# Patient Record
Sex: Male | Born: 1975 | Race: White | Hispanic: No | Marital: Married
Health system: Southern US, Community
[De-identification: ages and names within clinical notes are randomized; demographics above are authoritative.]

## PROBLEM LIST (undated history)

## (undated) DIAGNOSIS — Z87442 Personal history of urinary calculi: Secondary | ICD-10-CM

## (undated) DIAGNOSIS — K649 Unspecified hemorrhoids: Secondary | ICD-10-CM

## (undated) DIAGNOSIS — T7840XA Allergy, unspecified, initial encounter: Secondary | ICD-10-CM

## (undated) HISTORY — DX: Personal history of urinary calculi: Z87.442

## (undated) HISTORY — PX: HYDROCELE EXCISION: SHX482

## (undated) HISTORY — DX: Allergy, unspecified, initial encounter: T78.40XA

## (undated) HISTORY — DX: Unspecified hemorrhoids: K64.9

---

## 2009-07-06 ENCOUNTER — Ambulatory Visit (HOSPITAL_BASED_OUTPATIENT_CLINIC_OR_DEPARTMENT_OTHER): Admission: RE | Admit: 2009-07-06 | Discharge: 2009-07-06 | Payer: Self-pay | Admitting: Urology

## 2010-02-11 ENCOUNTER — Encounter: Admission: RE | Admit: 2010-02-11 | Discharge: 2010-02-11 | Payer: Self-pay | Admitting: Family Medicine

## 2015-05-17 ENCOUNTER — Ambulatory Visit: Payer: Self-pay | Admitting: Primary Care

## 2015-05-28 ENCOUNTER — Ambulatory Visit: Payer: Self-pay | Admitting: Primary Care

## 2015-05-30 ENCOUNTER — Ambulatory Visit: Payer: Self-pay | Admitting: Primary Care

## 2015-06-11 ENCOUNTER — Encounter: Payer: Self-pay | Admitting: Primary Care

## 2015-06-11 ENCOUNTER — Ambulatory Visit (INDEPENDENT_AMBULATORY_CARE_PROVIDER_SITE_OTHER): Payer: BC Managed Care – PPO | Admitting: Primary Care

## 2015-06-11 VITALS — BP 144/84 | HR 84 | Temp 98.3°F | Ht 74.0 in | Wt 224.0 lb

## 2015-06-11 DIAGNOSIS — R030 Elevated blood-pressure reading, without diagnosis of hypertension: Secondary | ICD-10-CM | POA: Diagnosis not present

## 2015-06-11 NOTE — Progress Notes (Signed)
Pre visit review using our clinic review tool, if applicable. No additional management support is needed unless otherwise documented below in the visit note. 

## 2015-06-11 NOTE — Patient Instructions (Signed)
Please schedule a physical with me in the next 3 months. You will also schedule a lab only appointment one week prior. We will discuss your lab results during your physical.  Check your blood pressure daily, around the same time of day, for the next 2 weeks.   Ensure that you have rested for 30 minutes prior to checking your blood pressure. Record your readings and call them into the office in 2 weeks.  It was a pleasure to meet you today! Please don't hesitate to call me with any questions. Welcome to [facility]!

## 2015-06-11 NOTE — Progress Notes (Signed)
Subjective:    Patient ID: Herbert Nichols, male    DOB: 1976/04/15, 39 y.o.   MRN: [id number]  HPI  Herbert Nichols is a 39 year old male who presents today to establish care and discuss the problems mentioned below. Will obtain old records. His last physical [facility] at [facility] over 5 years ago.   1) Asthma: Diagnosed as a child, had a few flare-ups as a teenager while playing sports. Denies flare-ups as an adult.   2) Elevated Blood Pressure Reading: He checks his blood pressure at home irregularly. His last reading was 3 months ago. He has noticed several readings of 130-140's/80's. Elevated in the clinic today. Denies chest pain, headaches, dizziness.   BP Readings from Last 3 Encounters:  06/11/15 144/84     Review of Systems  Constitutional: Negative for unexpected weight change.  HENT: Negative for rhinorrhea.   Respiratory: Negative for cough and shortness of breath.   Cardiovascular: Negative for chest pain.  Gastrointestinal: Negative for diarrhea and constipation.  Genitourinary: Negative for difficulty urinating.  Musculoskeletal: Negative for myalgias and arthralgias.  Skin: Negative for rash.  Allergic/Immunologic: Positive for environmental allergies.  Neurological: Negative for dizziness, numbness and headaches.  Psychiatric/Behavioral:       Denies concerns for anxiety or depression.       Past Medical History  Diagnosis Date  . Allergy   . History of kidney stones     5 to 6 years ago  . Hemorrhoids     Social History   Social History  . Marital Status: Married    Spouse Name: N/A  . Number of Children: N/A  . Years of Education: N/A   Occupational History  . Not on file.   Social History Main Topics  . Smoking status: Former Games developermoker  . Smokeless tobacco: Not on file  . Alcohol Use: 0.0 oz/week    0 Standard drinks or equivalent per week     Comment: social  . Drug Use: Not on file  . Sexual Activity: Not on file   Other Topics Concern    . Not on file   Social History Narrative   Married.   2 children.   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   Highest level of education is Masters.   Enjoys spending time with his family.     Past Surgical History  Procedure Laterality Date  . Hydrocele excision      Family History  Problem Relation Age of Onset  . Hypertension Mother   . Hypertension Father   . Mental illness Brother   . Colon cancer Maternal Aunt   . Hypertension Maternal Grandmother   . Mental illness Maternal Grandmother   . Hypertension Maternal Grandfather   . Diabetes Maternal Grandfather   . Asthma Paternal Grandmother   . Hyperlipidemia Paternal Grandmother   . Hypertension Paternal Grandmother   . Hypertension Paternal Grandfather   . Prostate cancer Maternal Grandfather     No Known Allergies  No current outpatient prescriptions on file prior to visit.   No current facility-administered medications on file prior to visit.    BP 144/84 mmHg  Pulse 84  Temp(Src) 98.3 F (36.8 C) (Oral)  Ht 6\' 2"  (1.88 m)  Wt 224 lb (101.606 kg)  BMI 28.75 kg/m2  SpO2 98%    Objective:   Physical Exam  Constitutional: He is oriented to person, place, and time. He appears well-nourished.  Cardiovascular: Normal rate and regular rhythm.  Pulmonary/Chest: Effort normal and breath sounds normal.  Neurological: He is alert and oriented to person, place, and time.  Skin: Skin is warm and dry.  Psychiatric: He has a normal mood and affect.          Assessment & Plan:

## 2015-06-11 NOTE — Assessment & Plan Note (Signed)
Elevated in clinic today.  He checks at home very irregularly and has noted readings around 130-140/80's. Strong family history of hypertension. Will have him record BP daily for 2 weeks and send them to me.

## 2015-06-25 ENCOUNTER — Telehealth: Payer: Self-pay | Admitting: Primary Care

## 2015-06-25 NOTE — Telephone Encounter (Signed)
Tried to call patient this morning and this afternoon. Received this message, "due network difficult cannot connect to subcriber."

## 2015-06-25 NOTE — Telephone Encounter (Signed)
Message left for patient to return my call for BP readings. 

## 2015-06-25 NOTE — Telephone Encounter (Signed)
Will you please check on Herbert Nichols's BP? His reading was elevated during his new patient appointment 2 weeks ago.

## 2015-06-26 ENCOUNTER — Encounter: Payer: Self-pay | Admitting: *Deleted

## 2015-06-26 NOTE — Telephone Encounter (Signed)
Sending patient a letter asking him to call our office for BP readings.

## 2015-06-27 ENCOUNTER — Telehealth: Payer: Self-pay | Admitting: Primary Care

## 2015-06-27 NOTE — Telephone Encounter (Signed)
BP levels noted and are stable.

## 2015-06-27 NOTE — Telephone Encounter (Signed)
Patient called back. Patient stated that he takes his BP in the evening and his BP reading are:  06/11/2015 124/84 06/12/2015 126/83 06/13/2015 130/84 06/14/2015 127/83 06/15/2015 126/86 06/16/2015 127/83 06/17/2015 122/83 06/18/2015 132/82 06/19/2015 122/87 06/20/2015 122/86 06/21/2015 -------- 06/22/2015 -------- 06/23/2015 121/81 06/24/2015 125/77 06/25/2015 129/81 06/26/2015 130/84

## 2015-08-06 ENCOUNTER — Other Ambulatory Visit: Payer: Self-pay | Admitting: Primary Care

## 2015-08-06 DIAGNOSIS — Z0000 Encounter for general adult medical examination without abnormal findings: Secondary | ICD-10-CM

## 2015-08-10 ENCOUNTER — Other Ambulatory Visit: Payer: BC Managed Care – PPO

## 2015-08-17 ENCOUNTER — Encounter: Payer: BC Managed Care – PPO | Admitting: Primary Care

## 2015-08-31 ENCOUNTER — Other Ambulatory Visit (INDEPENDENT_AMBULATORY_CARE_PROVIDER_SITE_OTHER): Payer: BC Managed Care – PPO

## 2015-08-31 DIAGNOSIS — Z0000 Encounter for general adult medical examination without abnormal findings: Secondary | ICD-10-CM

## 2015-08-31 LAB — COMPREHENSIVE METABOLIC PANEL
ALT: 32 U/L (ref 0–53)
AST: 22 U/L (ref 0–37)
Albumin: 4.5 g/dL (ref 3.5–5.2)
Alkaline Phosphatase: 48 U/L (ref 39–117)
BILIRUBIN TOTAL: 0.9 mg/dL (ref 0.2–1.2)
BUN: 16 mg/dL (ref 6–23)
CO2: 30 meq/L (ref 19–32)
CREATININE: 0.85 mg/dL (ref 0.40–1.50)
Calcium: 9.4 mg/dL (ref 8.4–10.5)
Chloride: 104 mEq/L (ref 96–112)
GFR: 105.51 mL/min (ref >60.00)
GLUCOSE: 101 mg/dL — AB (ref 70–99)
Potassium: 4.4 mEq/L (ref 3.5–5.1)
Sodium: 139 mEq/L (ref 135–145)
Total Protein: 7.1 g/dL (ref 6.0–8.3)

## 2015-08-31 LAB — HEMOGLOBIN A1C: Hgb A1c MFr Bld: 5.2 % (ref 4.6–6.5)

## 2015-08-31 LAB — LIPID PANEL
CHOL/HDL RATIO: 5
Cholesterol: 187 mg/dL (ref 0–200)
HDL: 37.4 mg/dL — ABNORMAL LOW (ref >39.00)
LDL CALC: 130 mg/dL — AB (ref 0–99)
NonHDL: 149.29
Triglycerides: 96 mg/dL (ref 0.0–149.0)
VLDL: 19.2 mg/dL (ref 0.0–40.0)

## 2015-09-07 ENCOUNTER — Encounter: Payer: Self-pay | Admitting: Primary Care

## 2015-09-07 ENCOUNTER — Ambulatory Visit (INDEPENDENT_AMBULATORY_CARE_PROVIDER_SITE_OTHER): Payer: BC Managed Care – PPO | Admitting: Primary Care

## 2015-09-07 VITALS — BP 134/84 | HR 91 | Temp 97.7°F | Ht 74.0 in | Wt 223.0 lb

## 2015-09-07 DIAGNOSIS — Z0000 Encounter for general adult medical examination without abnormal findings: Secondary | ICD-10-CM | POA: Diagnosis not present

## 2015-09-07 DIAGNOSIS — E7800 Pure hypercholesterolemia, unspecified: Secondary | ICD-10-CM | POA: Diagnosis not present

## 2015-09-07 DIAGNOSIS — R030 Elevated blood-pressure reading, without diagnosis of hypertension: Secondary | ICD-10-CM

## 2015-09-07 NOTE — Progress Notes (Signed)
Pre visit review using our clinic review tool, if applicable. No additional management support is needed unless otherwise documented below in the visit note. 

## 2015-09-07 NOTE — Assessment & Plan Note (Addendum)
LDL of 130 during physical. Low cardiac risk at this time. Discussed the importance of a healthy diet and regular exercise in order for weight loss and to reduce risk of other medical diseases.  Will recheck in 1 year.

## 2015-09-07 NOTE — Progress Notes (Signed)
Subjective:    Patient ID: Herbert Nichols, male    DOB: October 13, 1975, 40 y.o.   MRN: [id number]  HPI  Herbert Nichols is a 40 year old male who presents today for complete physical.  Immunizations: -Tetanus: Completed over 10 years ago.  -Influenza: Declines.   Diet: Endorses a fair diet. Breakfast: Oatmeal, cereal, bacon and eggs Lunch: Eats out (subway, chipotle) Dinner: Home cooked meals (meat, vegetables) Snacks: None Desserts: None Beverages: Coffee, water, soda  Exercise: He is currently not exercising. Eye exam: 2 years ago. Denies changes in vision. Dental exam: Completes semi-annually.    Review of Systems  Constitutional: Negative for fever and unexpected weight change.  HENT: Positive for congestion and sinus pressure.   Respiratory: Negative for cough and shortness of breath.   Cardiovascular: Negative for chest pain.  Gastrointestinal: Negative for diarrhea and constipation.  Genitourinary: Negative for difficulty urinating.  Musculoskeletal: Negative for myalgias and arthralgias.  Skin: Negative for rash.  Allergic/Immunologic: Positive for environmental allergies.  Neurological: Negative for dizziness, numbness and headaches.  Psychiatric/Behavioral:       Denies concerns for anxiety or depression       Past Medical History  Diagnosis Date  . Allergy   . History of kidney stones     5 to 6 years ago  . Hemorrhoids     Social History   Social History  . Marital Status: Married    Spouse Name: N/A  . Number of Children: N/A  . Years of Education: N/A   Occupational History  . Not on file.   Social History Main Topics  . Smoking status: Former Games developer  . Smokeless tobacco: Not on file  . Alcohol Use: 0.0 oz/week    0 Standard drinks or equivalent per week     Comment: social  . Drug Use: Not on file  . Sexual Activity: Not on file   Other Topics Concern  . Not on file   Social History Narrative   Married.   2 children.   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   Highest level of education is Masters.   Enjoys spending time with his family.     Past Surgical History  Procedure Laterality Date  . Hydrocele excision      Family History  Problem Relation Age of Onset  . Hypertension Mother   . Hypertension Father   . Mental illness Brother   . Colon cancer Maternal Aunt   . Hypertension Maternal Grandmother   . Mental illness Maternal Grandmother   . Hypertension Maternal Grandfather   . Diabetes Maternal Grandfather   . Asthma Paternal Grandmother   . Hyperlipidemia Paternal Grandmother   . Hypertension Paternal Grandmother   . Hypertension Paternal Grandfather   . Prostate cancer Maternal Grandfather     No Known Allergies  Current Outpatient Prescriptions on File Prior to Visit  Medication Sig Dispense Refill  . cetirizine (ZYRTEC) 10 MG tablet Take 10 mg by mouth daily.     No current facility-administered medications on file prior to visit.    BP 134/84 mmHg  Pulse 91  Temp(Src) 97.7 F (36.5 C) (Oral)  Ht  (1.88 m)  Wt 223 lb (101.152 kg)  BMI 28.62 kg/m2  SpO2 98%    Objective:   Physical Exam  Constitutional: He is oriented to person, place, and time. He appears well-nourished.  HENT:  Right Ear: Tympanic membrane and ear canal normal.  Left Ear: Tympanic membrane and  ear canal normal.  Nose: Nose normal. Right sinus exhibits no maxillary sinus tenderness and no frontal sinus tenderness. Left sinus exhibits no maxillary sinus tenderness and no frontal sinus tenderness.  Mouth/Throat: Oropharynx is clear and moist.  Eyes: Conjunctivae and EOM are normal. Pupils are equal, round, and reactive to light.  Neck: Neck supple. No thyromegaly present.  Cardiovascular: Normal rate, regular rhythm and normal heart sounds.   Pulmonary/Chest: Effort normal and breath sounds normal. He has no wheezes. He has no rales.  Abdominal: Soft. Bowel sounds are normal. There is no tenderness.    Musculoskeletal: Normal range of motion.  Neurological: He is alert and oriented to person, place, and time. He has normal reflexes. No cranial nerve deficit.  Skin: Skin is warm and dry.  Psychiatric: He has a normal mood and affect.          Assessment & Plan:

## 2015-09-07 NOTE — Assessment & Plan Note (Signed)
Tdap UTD. Declines flu. Exam unremarkable.  Labs with slightly elevated LDL. Discussed the importance of a healthy diet and regular exercise in order for weight loss and to reduce risk of other medical diseases.  Follow up in 1 year for repeat physical.

## 2015-09-07 NOTE — Patient Instructions (Signed)
It is important that you improve your diet. Please limit fast foods, fried foods, greasy foods, sugary drinks, etc. Increase your consumption of fresh fruits and vegetables.  You need to consume about 2 liters of water daily.  Start exercising. You should be getting 1 hour of moderate intensity exercise 5 days weekly.  Your labs show borderline high cholesterol. Work on making the changes discussed this morning and we will recheck your levels in 1 year.  Follow up in 1 year for repeat physical or sooner if needed.  It was a pleasure to see you today!

## 2015-09-07 NOTE — Assessment & Plan Note (Signed)
Stable in office today. Home readings stable. Will continue to monitor.

## 2016-08-29 ENCOUNTER — Other Ambulatory Visit: Payer: Self-pay | Admitting: Primary Care

## 2016-08-29 DIAGNOSIS — Z0000 Encounter for general adult medical examination without abnormal findings: Secondary | ICD-10-CM

## 2016-09-05 ENCOUNTER — Other Ambulatory Visit: Payer: BC Managed Care – PPO

## 2016-09-12 ENCOUNTER — Encounter: Payer: BC Managed Care – PPO | Admitting: Primary Care

## 2019-10-01 ENCOUNTER — Emergency Department (HOSPITAL_COMMUNITY): Payer: BC Managed Care – PPO

## 2019-10-01 ENCOUNTER — Emergency Department (HOSPITAL_COMMUNITY)
Admission: EM | Admit: 2019-10-01 | Discharge: 2019-10-01 | Disposition: A | Discharge status: Home / Self Care | Payer: BC Managed Care – PPO | Attending: Emergency Medicine | Admitting: Emergency Medicine

## 2019-10-01 ENCOUNTER — Other Ambulatory Visit: Payer: Self-pay

## 2019-10-01 ENCOUNTER — Encounter (HOSPITAL_COMMUNITY): Payer: Self-pay | Admitting: Emergency Medicine

## 2019-10-01 DIAGNOSIS — S0101XA Laceration without foreign body of scalp, initial encounter: Secondary | ICD-10-CM | POA: Insufficient documentation

## 2019-10-01 DIAGNOSIS — Y999 Unspecified external cause status: Secondary | ICD-10-CM | POA: Insufficient documentation

## 2019-10-01 DIAGNOSIS — S060X9A Concussion with loss of consciousness of unspecified duration, initial encounter: Secondary | ICD-10-CM | POA: Diagnosis not present

## 2019-10-01 DIAGNOSIS — Z23 Encounter for immunization: Secondary | ICD-10-CM | POA: Diagnosis not present

## 2019-10-01 DIAGNOSIS — T148XXA Other injury of unspecified body region, initial encounter: Secondary | ICD-10-CM

## 2019-10-01 DIAGNOSIS — W11XXXA Fall on and from ladder, initial encounter: Secondary | ICD-10-CM | POA: Diagnosis not present

## 2019-10-01 DIAGNOSIS — Y93H2 Activity, gardening and landscaping: Secondary | ICD-10-CM | POA: Insufficient documentation

## 2019-10-01 DIAGNOSIS — Y929 Unspecified place or not applicable: Secondary | ICD-10-CM | POA: Diagnosis not present

## 2019-10-01 DIAGNOSIS — W1789XA Other fall from one level to another, initial encounter: Secondary | ICD-10-CM

## 2019-10-01 LAB — COMPREHENSIVE METABOLIC PANEL
ALT: 51 U/L — ABNORMAL HIGH (ref 0–44)
AST: 39 U/L (ref 15–41)
Albumin: 4.5 g/dL (ref 3.5–5.0)
Alkaline Phosphatase: 47 U/L (ref 38–126)
Anion gap: 15 (ref 5–15)
BUN: 15 mg/dL (ref 6–20)
CO2: 23 mmol/L (ref 22–32)
Calcium: 9.6 mg/dL (ref 8.9–10.3)
Chloride: 103 mmol/L (ref 98–111)
Creatinine, Ser: 0.98 mg/dL (ref 0.61–1.24)
GFR calc Af Amer: >60 mL/min (ref >60)
GFR calc non Af Amer: >60 mL/min (ref >60)
Glucose, Bld: 88 mg/dL (ref 70–99)
Potassium: 3.6 mmol/L (ref 3.5–5.1)
Sodium: 141 mmol/L (ref 135–145)
Total Bilirubin: 1.1 mg/dL (ref 0.3–1.2)
Total Protein: 7.1 g/dL (ref 6.5–8.1)

## 2019-10-01 LAB — CBC WITH DIFFERENTIAL/PLATELET
Abs Immature Granulocytes: 0.08 10*3/uL — ABNORMAL HIGH (ref 0.00–0.07)
Basophils Absolute: 0.1 10*3/uL (ref 0.0–0.1)
Basophils Relative: 1 %
Eosinophils Absolute: 0.2 10*3/uL (ref 0.0–0.5)
Eosinophils Relative: 2 %
HCT: 49.2 % (ref 39.0–52.0)
Hemoglobin: 15.9 g/dL (ref 13.0–17.0)
Immature Granulocytes: 1 %
Lymphocytes Relative: 24 %
Lymphs Abs: 1.8 10*3/uL (ref 0.7–4.0)
MCH: 30.7 pg (ref 26.0–34.0)
MCHC: 32.3 g/dL (ref 30.0–36.0)
MCV: 95 fL (ref 80.0–100.0)
Monocytes Absolute: 0.5 10*3/uL (ref 0.1–1.0)
Monocytes Relative: 7 %
Neutro Abs: 5.1 10*3/uL (ref 1.7–7.7)
Neutrophils Relative %: 65 %
Platelets: 227 10*3/uL (ref 150–400)
RBC: 5.18 MIL/uL (ref 4.22–5.81)
RDW: 12.5 % (ref 11.5–15.5)
WBC: 7.7 10*3/uL (ref 4.0–10.5)
nRBC: 0 % (ref 0.0–0.2)

## 2019-10-01 LAB — URINALYSIS, ROUTINE W REFLEX MICROSCOPIC
Bacteria, UA: NONE SEEN
Bilirubin Urine: NEGATIVE
Glucose, UA: NEGATIVE mg/dL
Hgb urine dipstick: NEGATIVE
Ketones, ur: 5 mg/dL — AB
Leukocytes,Ua: NEGATIVE
Nitrite: NEGATIVE
Protein, ur: 30 mg/dL — AB
Specific Gravity, Urine: 1.02 (ref 1.005–1.030)
pH: 6 (ref 5.0–8.0)

## 2019-10-01 LAB — LIPASE, BLOOD: Lipase: 31 U/L (ref 11–51)

## 2019-10-01 MED ORDER — TETANUS-DIPHTH-ACELL PERTUSSIS 5-2.5-18.5 LF-MCG/0.5 IM SUSP
0.5000 mL | Freq: Once | INTRAMUSCULAR | Status: AC
  Administered 2019-10-01: 0.5 mL via INTRAMUSCULAR
  Filled 2019-10-01: qty 0.5

## 2019-10-01 MED ORDER — CYCLOBENZAPRINE HCL 10 MG PO TABS: 10.0000 mg | ORAL_TABLET | Freq: Two times a day (BID) | ORAL | 0 refills | Status: AC | PRN

## 2019-10-01 NOTE — ED Triage Notes (Signed)
Pt was outside working in the yard. Climbed a ladder about 30 ft up, cut a tree limb, when it fell the tree took out the ladder under him. Pt has lac to back of head. Bleeding controlled. Pt unable to say month or year on arrival. Repetitive questioning, does not remember event

## 2019-10-01 NOTE — Discharge Instructions (Signed)
You were evaluated in the Emergency Department and after careful evaluation, we did not find any emergent condition requiring admission or further testing in the hospital.  Your exam/testing today is overall reassuring.  Your symptoms seem to be due to a concussion.  As discussed, we recommend mental and physical rest for the next 3 to 5 days or until you are back to your normal self.  After that, we recommend a gradual return to normal daily activities.  Your staples will need to be removed in 10 to 14 days by healthcare professional.  If you continue to feel the tingling sensation in your hand after 2 weeks, you can follow-up with the neurosurgeons.  Please return to the Emergency Department if you experience any worsening of your condition.  We encourage you to follow up with a primary care provider.  Thank you for allowing [place] to be a part of your care.

## 2019-10-01 NOTE — ED Notes (Signed)
Patient transported to CT 

## 2019-10-01 NOTE — Progress Notes (Signed)
Orthopedic Tech Progress Note Patient Details:  Herbert Nichols Mar 20, 1976 [id number] Level 2 trauma Patient ID: [name], male   DOB: [birth year], 44 y.o.   MRN: [id number]   [name] 10/01/2019, 12:16 PM

## 2019-10-01 NOTE — ED Provider Notes (Signed)
  Provider Note MRN:  [id number]  Arrival date & time: 10/01/19    ED Course and Medical Decision Making  Assumed care from Dr. Clarene Duke at shift change.  Fall from height, scalp laceration is repaired, paresthesias continued in the upper extremity, awaiting MRI cervical spine.  Likely appropriate for discharge with reassuring MRI.  6:56 PM update: MRI without any acute injuries to the cord.  Question of increased signal at T2 and T3, however patient is not point tender at this location.  Still the MRI results were discussed with Dr. Franky Macho of neurosurgery, who was reassured by the exam and feels the patient is appropriate for discharge and follow-up if still having paresthesias in 2 weeks.  Procedures  Final Clinical Impressions(s) / ED Diagnoses     ICD-10-CM   1. Fall from height of greater than 3 feet  W17.89XA   2. Bruising  T14.8XXA   3. Concussion with loss of consciousness, initial encounter  S06.0X9A   4. Laceration of scalp, initial encounter  S01.01XA     ED Discharge Orders    None        Discharge Instructions     You were evaluated in the Emergency Department and after careful evaluation, we did not find any emergent condition requiring admission or further testing in the hospital.  Your exam/testing today is overall reassuring.  Your symptoms seem to be due to a concussion.  As discussed, we recommend mental and physical rest for the next 3 to 5 days or until you are back to your normal self.  After that, we recommend a gradual return to normal daily activities.  Your staples will need to be removed in 10 to 14 days by healthcare professional.  If you continue to feel the tingling sensation in your hand after 2 weeks, you can follow-up with the neurosurgeons.  Please return to the Emergency Department if you experience any worsening of your condition.  We encourage you to follow up with a primary care provider.  Thank you for allowing [place] to be a part of your  care.     Elmer Sow. Pilar Plate, MD [facility] Emergency Medicine [facility] [email or web address]    Sabas Sous, MD 10/01/19 [id number]

## 2019-10-01 NOTE — ED Provider Notes (Signed)
[facility] EMERGENCY DEPARTMENT Provider Note   CSN: [id number] Arrival date & time: 10/01/19  1209     History No chief complaint on file.   Herbert Nichols is a 44 y.o. male.  44yo M who p/w fall and head injury.  Just prior to arrival, the patient was 20 to 30 feet up a ladder cutting some trees when the tree limb reportedly fell and struck the ladder, causing him to fall to the ground.  He apparently hit the back of his head and sustained a laceration.  Patient recalls no details of the event including no recall of getting the latter in the first place.  He has had GCS of 14 with repetitive questioning and some disorientation for EMS.  He reports mild pain in the back of his head where he cut it as well as some numbness and tingling of his forearms and hands bilaterally.  He denies any extremity pain, chest pain, breathing problems, abdominal pain, or other areas of injury.  He cannot recall his last tetanus vaccination.  LEVEL 5 CAVEAT DUE TO AMS  The history is provided by the patient.       History reviewed. No pertinent past medical history.  There are no problems to display for this patient.   ** The histories are not reviewed yet. Please review them in the "History" navigator section and refresh this SmartLink.     History reviewed. No pertinent family history.  Social History   Tobacco Use   Smoking status: Not on file  Substance Use Topics   Alcohol use: Not on file   Drug use: Not on file    Home Medications Prior to Admission medications   Not on File    Allergies    Patient has no known allergies.  Review of Systems   Review of Systems  Unable to perform ROS: Mental status change    Physical Exam Updated Vital Signs BP 127/89 (BP Location: Left Arm)    Pulse (!) 108    Temp 98.7 F (37.1 C) (Oral)    Resp 16    Ht 6\' 3"  (1.905 m)    Wt 97.5 kg    SpO2 100%    BMI 26.87 kg/m   Physical Exam Vitals and nursing note  reviewed.  Constitutional:      General: He is not in acute distress.    Appearance: He is well-developed.  HENT:     Head: Normocephalic.     Mouth/Throat:     Mouth: Mucous membranes are moist.     Pharynx: Oropharynx is clear.  Eyes:     Conjunctiva/sclera: Conjunctivae normal.     Pupils: Pupils are equal, round, and reactive to light.  Neck:     Comments: In c-collar Cardiovascular:     Rate and Rhythm: Normal rate and regular rhythm.     Heart sounds: Normal heart sounds. No murmur.  Pulmonary:     Effort: Pulmonary effort is normal.     Breath sounds: Normal breath sounds.  Abdominal:     General: Bowel sounds are normal. There is no distension.     Palpations: Abdomen is soft.     Tenderness: There is no abdominal tenderness.  Musculoskeletal:        General: Normal range of motion.     Comments: Mild swelling of L olecranon bursa w/ abrasion; no midline spinal tenderness  Skin:    General: Skin is warm and dry.  Comments: Scattered abrasions b/l arms and L elbow, L lower back;  0.7 laceration occipital scalp  Neurological:     Mental Status: He is alert.     Comments: Fluent speech, GCS 14 with repetitive questioning, CN II-XII intact, subjective tingling of b/l arms but 5/5 strength x all 4 ext  Psychiatric:        Judgment: Judgment normal.     ED Results / Procedures / Treatments   Labs (all labs ordered are listed, but only abnormal results are displayed) Labs Reviewed  COMPREHENSIVE METABOLIC PANEL - Abnormal; Notable for the following components:      Result Value   ALT 51 (*)    All other components within normal limits  CBC WITH DIFFERENTIAL/PLATELET - Abnormal; Notable for the following components:   Abs Immature Granulocytes 0.08 (*)    All other components within normal limits  LIPASE, BLOOD  URINALYSIS, ROUTINE W REFLEX MICROSCOPIC    EKG EKG Interpretation  Date/Time:  Saturday October 01 2019 12:12:47 EST Ventricular Rate:  97 PR  Interval:    QRS Duration: 90 QT Interval:  345 QTC Calculation: 439 R Axis:   62 Text Interpretation: Sinus rhythm No previous ECGs available Confirmed by Theotis Burrow [phone]) on 10/01/2019 12:27:30 PM   Radiology DG Chest 2 View  Result Date: 10/01/2019 CLINICAL DATA:  Fall off ladder EXAM: CHEST - 2 VIEW COMPARISON:  None. FINDINGS: The heart size and mediastinal contours are within normal limits. Both lungs are clear. The visualized skeletal structures are unremarkable. IMPRESSION: No acute abnormality of the lungs in AP portable projection. Electronically Signed   By: Eddie Candle M.D.   On: 10/01/2019 13:19   CT Head Wo Contrast  Result Date: 10/01/2019 CLINICAL DATA:  44 year old male with head and neck injury from fall today. Loss of consciousness. EXAM: CT HEAD WITHOUT CONTRAST CT CERVICAL SPINE WITHOUT CONTRAST TECHNIQUE: Multidetector CT imaging of the head and cervical spine was performed following the standard protocol without intravenous contrast. Multiplanar CT image reconstructions of the cervical spine were also generated. COMPARISON:  None. FINDINGS: CT HEAD FINDINGS Brain: No evidence of acute infarction, hemorrhage, hydrocephalus, extra-axial collection or mass lesion/mass effect. Vascular: No hyperdense vessel or unexpected calcification. Skull: Normal. Negative for fracture or focal lesion. Sinuses/Orbits: No acute finding. Other: RIGHT posterior scalp soft tissue swelling noted. CT CERVICAL SPINE FINDINGS Alignment: Normal. Skull base and vertebrae: No acute fracture. No primary bone lesion or focal pathologic process. Soft tissues and spinal canal: No prevertebral fluid or swelling. No visible canal hematoma. Disc levels:  Unremarkable Upper chest: No acute abnormality Other: None IMPRESSION: 1. No evidence of intracranial abnormality. 2. RIGHT posterior scalp soft tissue swelling without fracture. 3. No static evidence of acute injury to the cervical spine. Electronically  Signed   By: Margarette Canada M.D.   On: 10/01/2019 13:15   CT Cervical Spine Wo Contrast  Result Date: 10/01/2019 CLINICAL DATA:  44 year old male with head and neck injury from fall today. Loss of consciousness. EXAM: CT HEAD WITHOUT CONTRAST CT CERVICAL SPINE WITHOUT CONTRAST TECHNIQUE: Multidetector CT imaging of the head and cervical spine was performed following the standard protocol without intravenous contrast. Multiplanar CT image reconstructions of the cervical spine were also generated. COMPARISON:  None. FINDINGS: CT HEAD FINDINGS Brain: No evidence of acute infarction, hemorrhage, hydrocephalus, extra-axial collection or mass lesion/mass effect. Vascular: No hyperdense vessel or unexpected calcification. Skull: Normal. Negative for fracture or focal lesion. Sinuses/Orbits: No acute finding. Other: RIGHT posterior  scalp soft tissue swelling noted. CT CERVICAL SPINE FINDINGS Alignment: Normal. Skull base and vertebrae: No acute fracture. No primary bone lesion or focal pathologic process. Soft tissues and spinal canal: No prevertebral fluid or swelling. No visible canal hematoma. Disc levels:  Unremarkable Upper chest: No acute abnormality Other: None IMPRESSION: 1. No evidence of intracranial abnormality. 2. RIGHT posterior scalp soft tissue swelling without fracture. 3. No static evidence of acute injury to the cervical spine. Electronically Signed   By: Harmon Pier M.D.   On: 10/01/2019 13:15    Procedures .Marland KitchenLaceration Repair  Date/Time: 10/01/2019 2:22 PM Performed by: Laurence Spates, MD Authorized by: Laurence Spates, MD   Consent:    Consent obtained:  Verbal   Consent given by:  Patient   Risks discussed:  Pain   Alternatives discussed:  No treatment Anesthesia (see MAR for exact dosages):    Anesthesia method:  None Laceration details:    Location:  Scalp   Scalp location:  Occipital   Length (cm):  0.7 Repair type:    Repair type:  Simple Treatment:    Area  cleansed with:  Betadine   Amount of cleaning:  Standard   Irrigation solution:  Sterile saline   Irrigation method:  Syringe Skin repair:    Repair method:  Staples   Number of staples:  2 Approximation:    Approximation:  Close Post-procedure details:    Dressing:  Open (no dressing)   Patient tolerance of procedure:  Tolerated well, no immediate complications   (including critical care time)  Medications Ordered in ED Medications - No data to display  ED Course  I have reviewed the triage vital signs and the nursing notes.  Pertinent labs & imaging results that were available during my care of the patient were reviewed by me and considered in my medical decision making (see chart for details).    MDM Rules/Calculators/A&P                       GCS 14 from repetitive questioning on initial exam however demonstrating full strength throughout.  Reassuring vital signs.  No abdominal or chest wall tenderness. Imaging of head and c-spine negative acute. CXR clear. Screening labs reassuring. On reassessment, he is well appearing and denying significant pain.  He does continue to complain of numbness/tingling in his left upper extremity particularly in his hand.  Because of the persistence of this new neurologic deficit, will proceed with cervical spine MRI to rule out cord injury or ligamentous injury.  I am signing out to the oncoming provider pending imaging results.  I have contacted the patient's wife to update her on work-up thus far and plan. Final Clinical Impression(s) / ED Diagnoses Final diagnoses:  None    Rx / DC Orders ED Discharge Orders    None       Jackie Russman, Ambrose Finland, MD 10/01/19 1426

## 2019-10-01 NOTE — ED Notes (Signed)
TRN transported pt to CT and xray without complications.  Applied more warm blankets to pt. VS stable.

## 2019-10-01 NOTE — Progress Notes (Signed)
Patient ID: Herbert Nichols, male   DOB: 1975-10-31, 44 y.o.   MRN: [id number] Films reviewed, no canal compromise. I am not familiar with the descriptive term microfractures. There is no instability. The facets are well aligned on the ct. OK for discharge.

## 2019-10-03 ENCOUNTER — Encounter: Payer: Self-pay | Admitting: Primary Care

## 2020-11-07 LAB — COLOGUARD: COLOGUARD: NEGATIVE

## 2021-05-23 IMAGING — CT CT CERVICAL SPINE W/O CM
3 of 4 series · 13 of 33 positions shown, 16 images · non-contrast
Comparison: None.

CLINICAL DATA: 43-year-old male with head and neck injury from fall
today. Loss of consciousness.

EXAM:
CT HEAD WITHOUT CONTRAST
CT CERVICAL SPINE WITHOUT CONTRAST
TECHNIQUE: Multidetector CT imaging of the head and cervical spine was
performed following the standard protocol without intravenous
contrast. Multiplanar CT image reconstructions of the cervical spine
were also generated.

[Series 5: c_spine 2.0 st · axial · 0.40mm/px · z∈[+1361,+1515]mm · 5 of 117 slices shown, 7 images]
[im 20/117  soft-tissue]
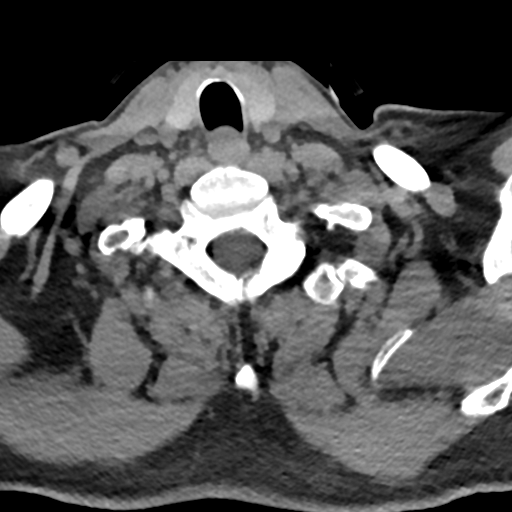
[im 20/117  bone]
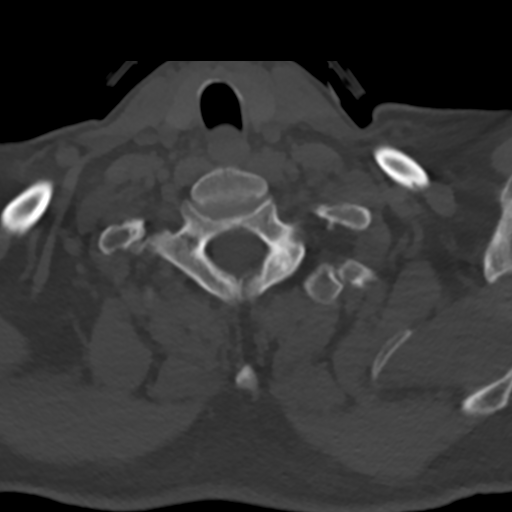
[im 39/117  bone]
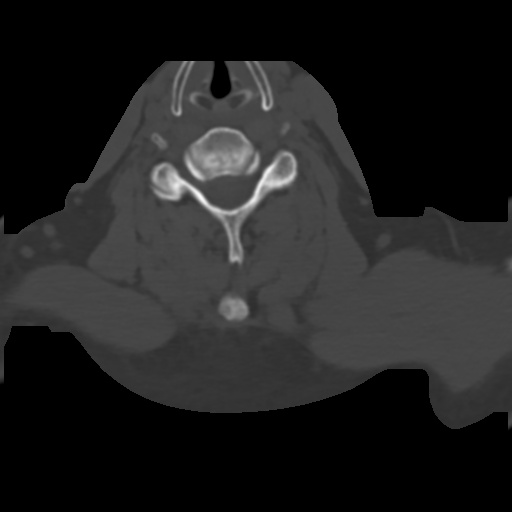
[im 59/117  bone]
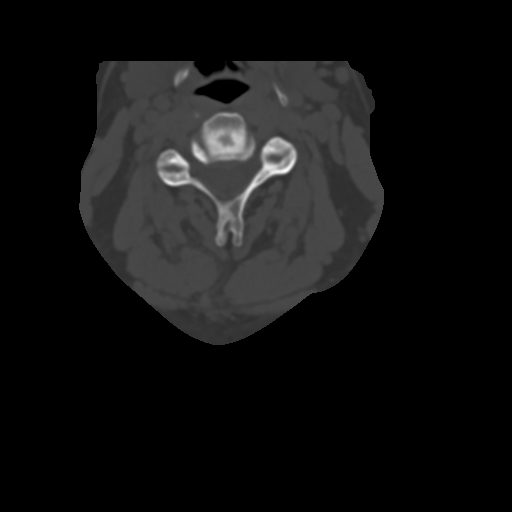
[im 78/117  bone]
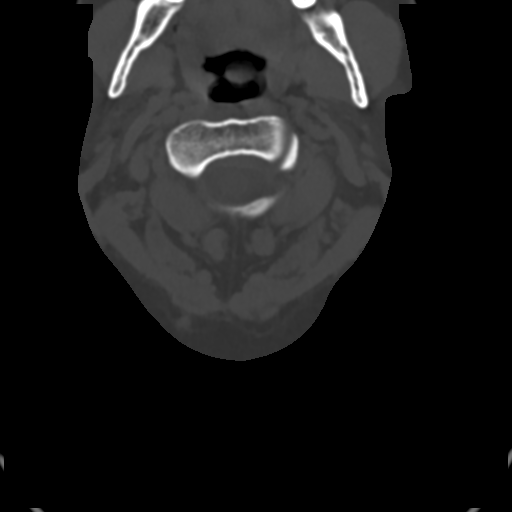
[im 97/117  soft-tissue]
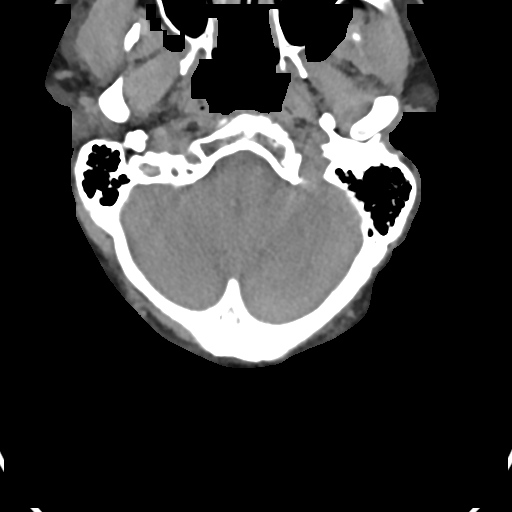
[im 97/117  bone]
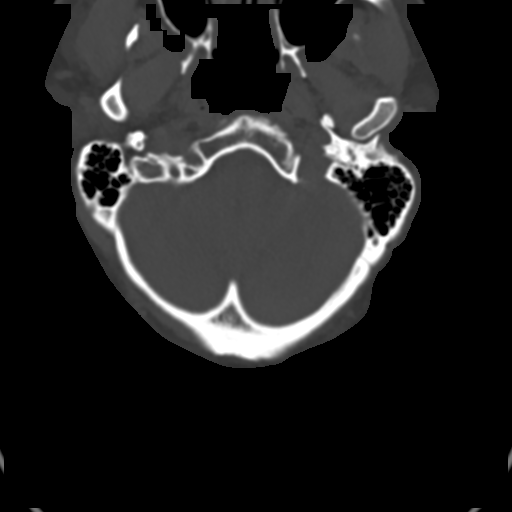

[Series 6: coronal bone · coronal · 0.36mm/px · 3 of 79 slices shown]
[im 16/79  bone]
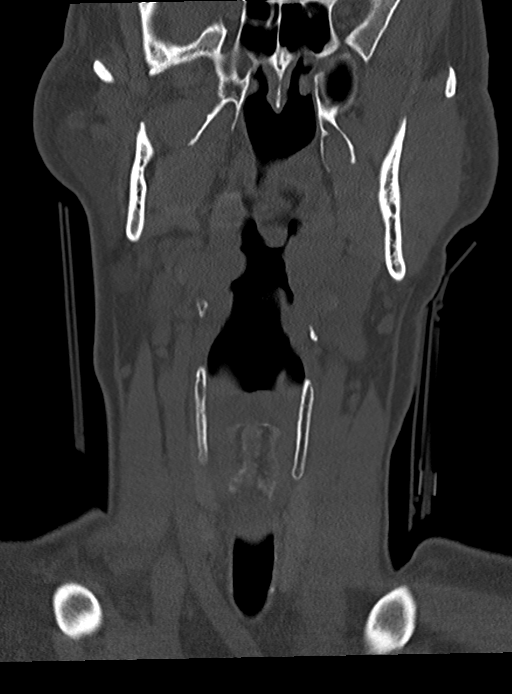
[im 32/79  bone]
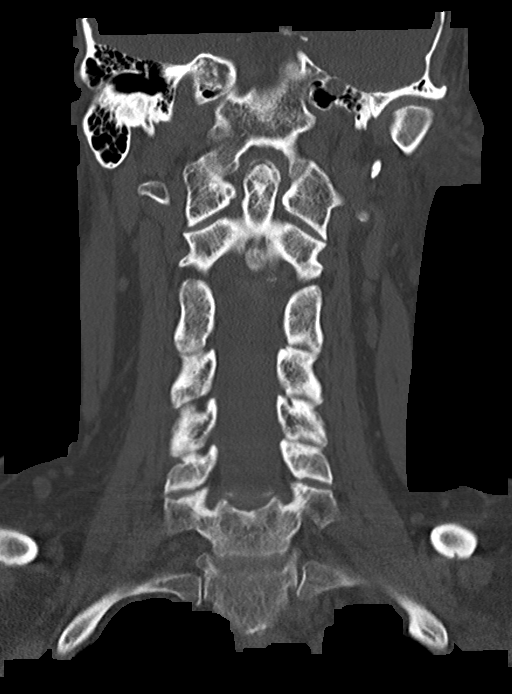
[im 47/79  bone]
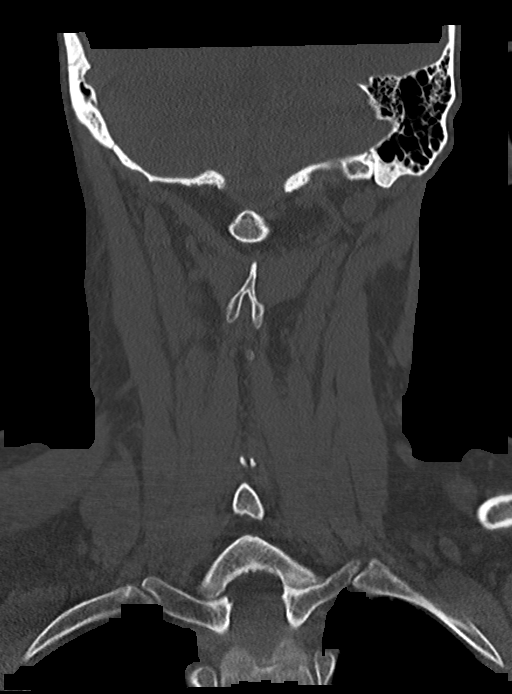

[Series 7: sagittal bone · sagittal · 0.34mm/px · 5 of 50 slices shown, 6 images]
[im 17/50  bone]
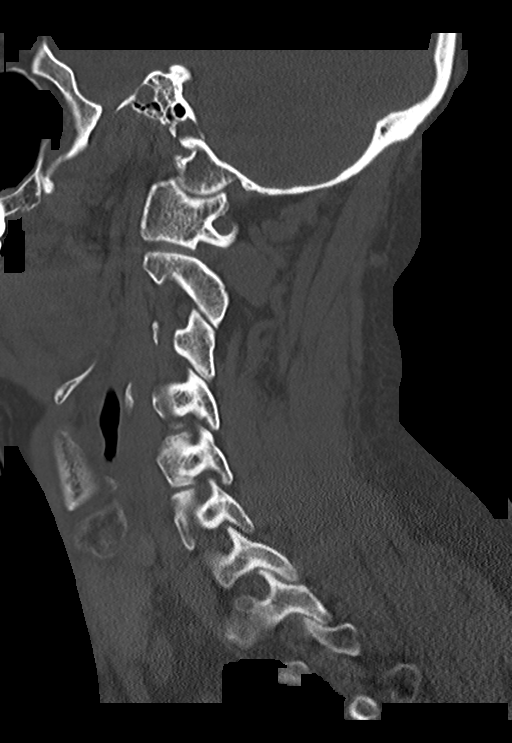
[im 21/50  bone]
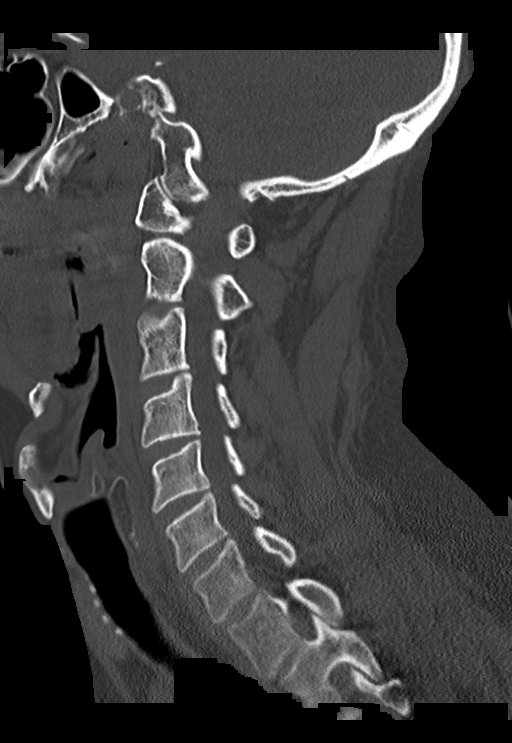
[im 25/50  soft-tissue]
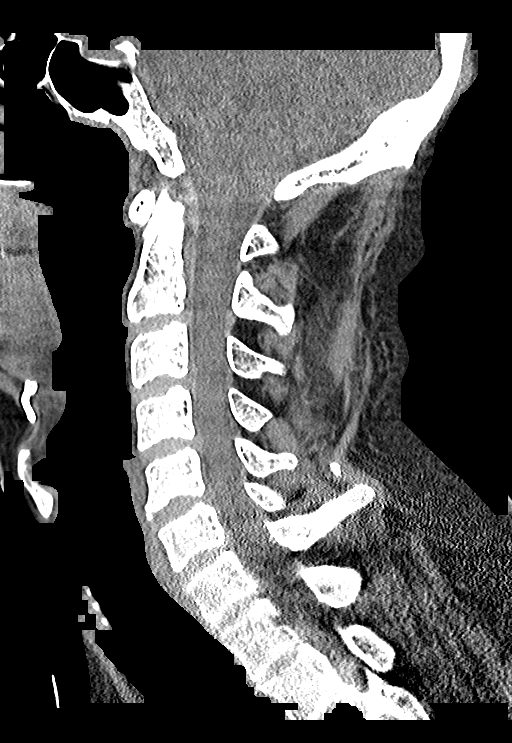
[im 25/50  bone]
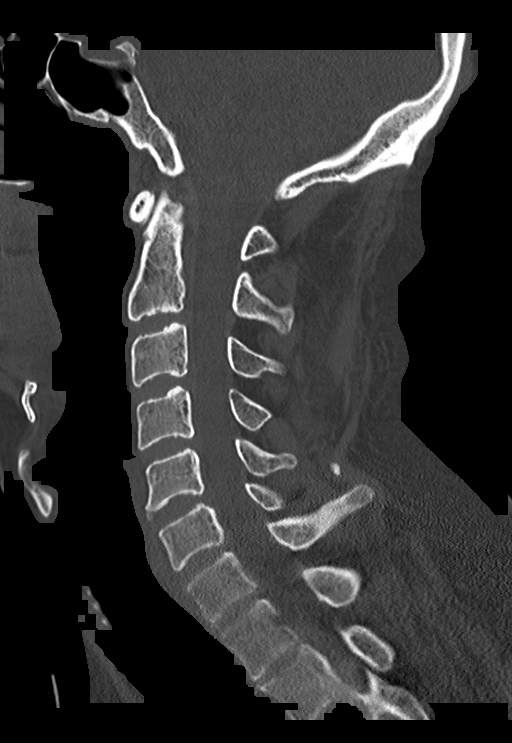
[im 29/50  bone]
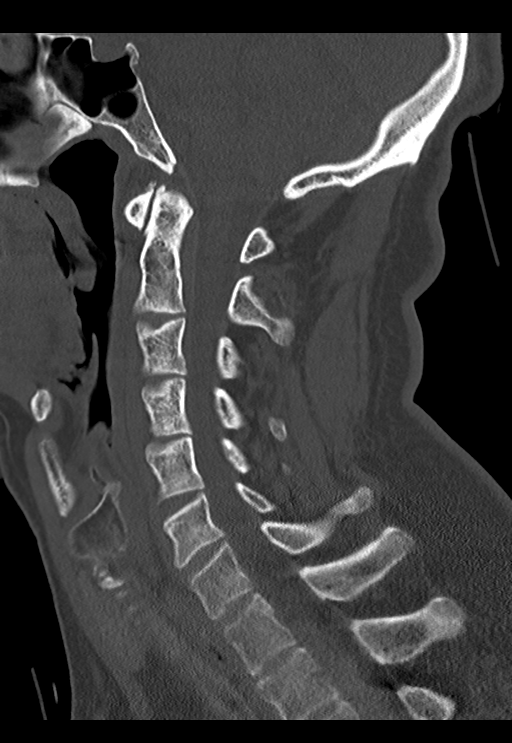
[im 33/50  bone]
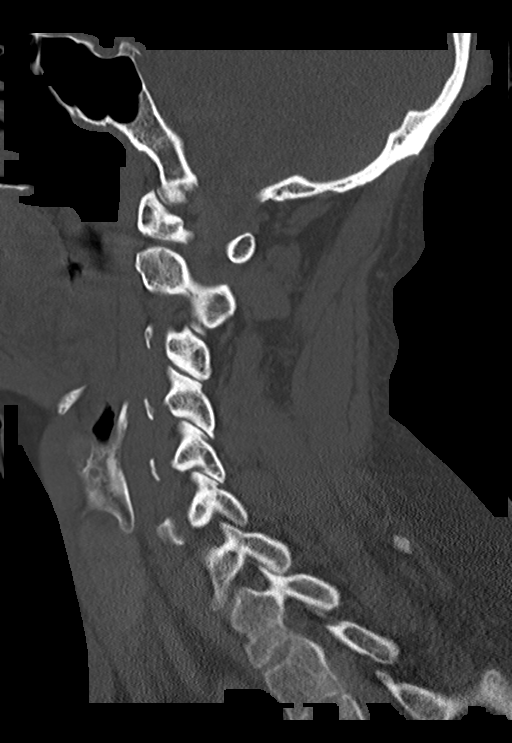

[13 of 33 positions shown; findings below may reference images not displayed]

FINDINGS: CT HEAD FINDINGS

Brain: No evidence of acute infarction, hemorrhage, hydrocephalus,
extra-axial collection or mass lesion/mass effect.

Vascular: No hyperdense vessel or unexpected calcification.

Skull: Normal. Negative for fracture or focal lesion.

Sinuses/Orbits: No acute finding.

Other: RIGHT posterior scalp soft tissue swelling noted.

CT CERVICAL SPINE FINDINGS

Alignment: Normal.

Skull base and vertebrae: No acute fracture. No primary bone lesion
or focal pathologic process.

Soft tissues and spinal canal: No prevertebral fluid or swelling. No
visible canal hematoma.

Disc levels:  Unremarkable

Upper chest: No acute abnormality

Other: None
IMPRESSION: 1. No evidence of intracranial abnormality.
2. RIGHT posterior scalp soft tissue swelling without fracture.
3. No static evidence of acute injury to the cervical spine.
